# Patient Record
Sex: Female | Born: 1965 | Race: Black or African American | Hispanic: No | Smoking: Never smoker
Health system: Southern US, Community
[De-identification: ages and names within clinical notes are randomized; demographics above are authoritative.]

## PROBLEM LIST (undated history)

## (undated) DIAGNOSIS — M329 Systemic lupus erythematosus, unspecified: Secondary | ICD-10-CM

## (undated) DIAGNOSIS — IMO0002 Reserved for concepts with insufficient information to code with codable children: Secondary | ICD-10-CM

## (undated) DIAGNOSIS — K219 Gastro-esophageal reflux disease without esophagitis: Secondary | ICD-10-CM

---

## 2003-10-08 ENCOUNTER — Encounter: Admission: RE | Admit: 2003-10-08 | Discharge: 2003-10-08 | Payer: Self-pay | Admitting: Occupational Medicine

## 2004-04-26 ENCOUNTER — Emergency Department (HOSPITAL_COMMUNITY): Admission: EM | Admit: 2004-04-26 | Discharge: 2004-04-26 | Payer: Self-pay | Admitting: Family Medicine

## 2004-09-08 ENCOUNTER — Ambulatory Visit: Payer: Self-pay | Admitting: Family Medicine

## 2005-09-08 ENCOUNTER — Ambulatory Visit: Payer: Self-pay | Admitting: Hematology & Oncology

## 2005-10-24 ENCOUNTER — Ambulatory Visit: Payer: Self-pay | Admitting: Hematology & Oncology

## 2005-12-26 ENCOUNTER — Ambulatory Visit: Payer: Self-pay | Admitting: Hematology & Oncology

## 2006-02-27 ENCOUNTER — Ambulatory Visit: Payer: Self-pay | Admitting: Hematology & Oncology

## 2006-09-21 ENCOUNTER — Ambulatory Visit (HOSPITAL_COMMUNITY): Admission: RE | Admit: 2006-09-21 | Discharge: 2006-09-21 | Payer: Self-pay | Admitting: Obstetrics and Gynecology

## 2006-10-26 ENCOUNTER — Encounter: Admission: RE | Admit: 2006-10-26 | Discharge: 2006-10-26 | Payer: Self-pay | Admitting: Obstetrics and Gynecology

## 2007-12-21 IMAGING — MG MM DIAGNOSTIC UNILATERAL L
2 series · 2 of 2 positions shown · non-contrast
Comparison: none

DG DIAGNOSTIC UNILATERAL L
CC and MLO view(s) were taken of the left breast.

DIGITAL UNILATERAL LEFT DIAGNOSTIC MAMMOGRAM WITH CAD:
CLINICAL DATA: Possible left breast mass at recent screening mammography.
True lateral and spot compression oblique views of the left breast were obtained.  With spot 
compression, normal-appearing parenchyma is demonstrated at the location of the recently suspected 
mass.  No mass is seen on the true lateral view.

[L MLO]
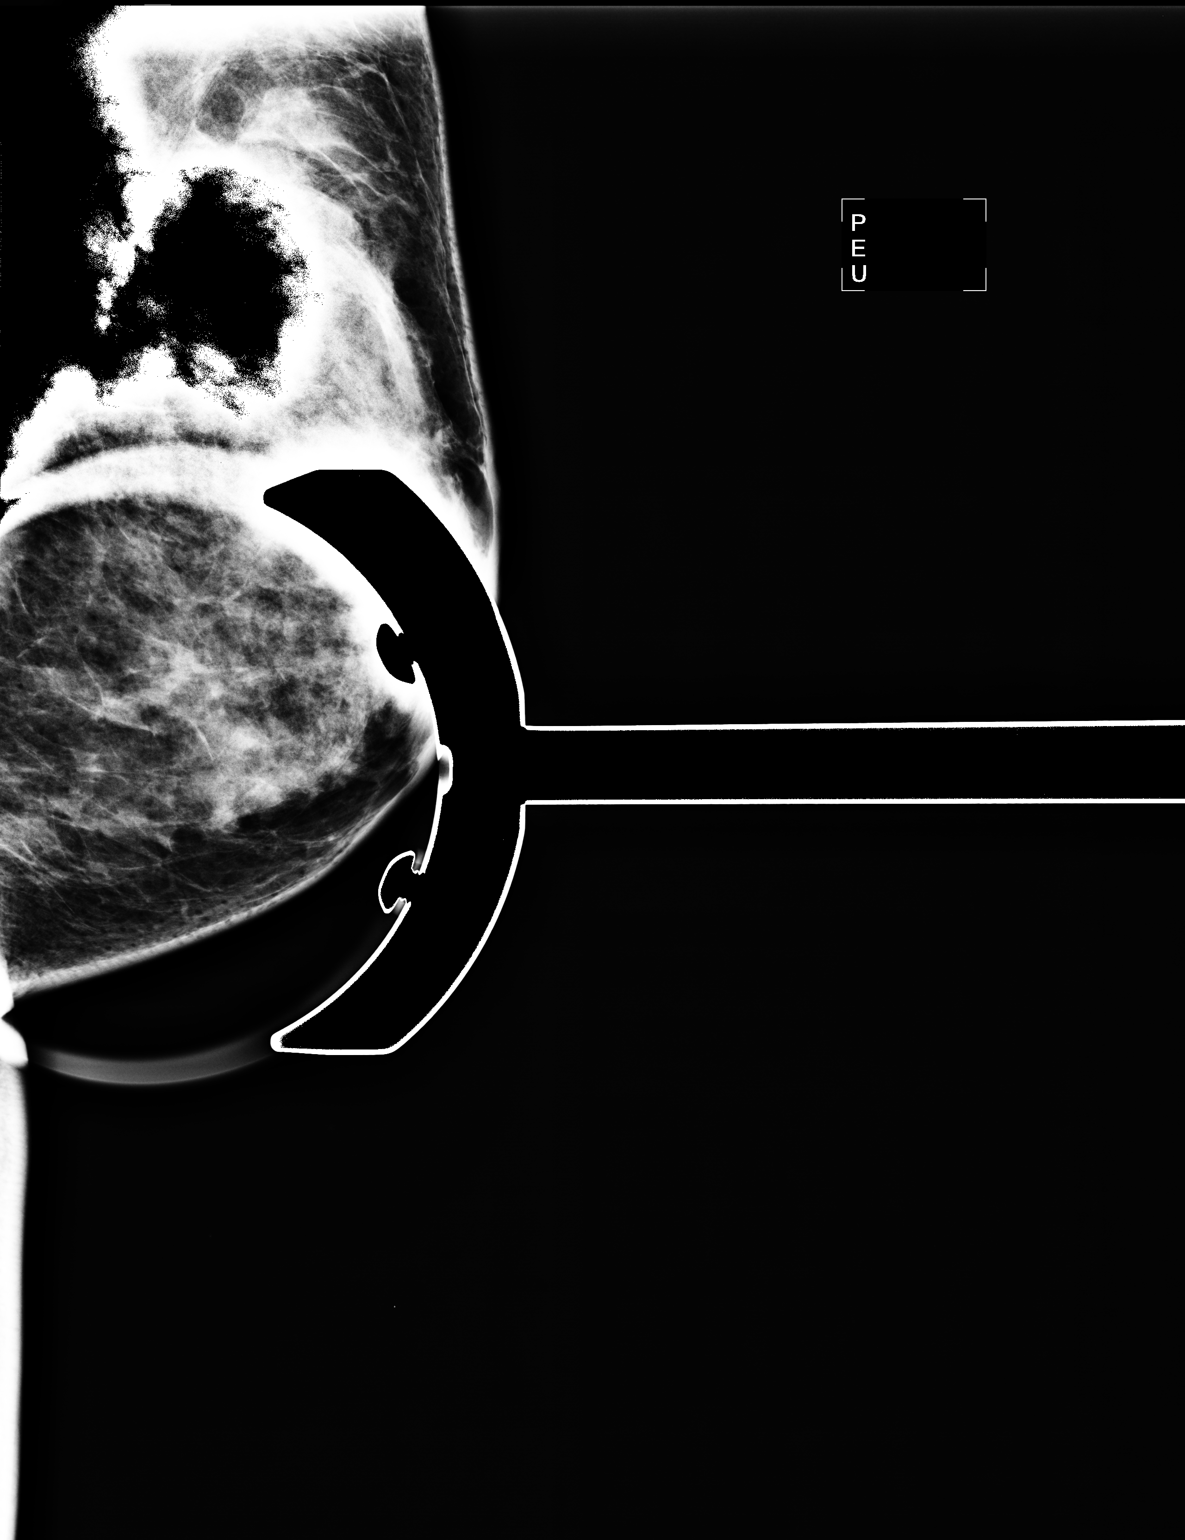

[L ML]
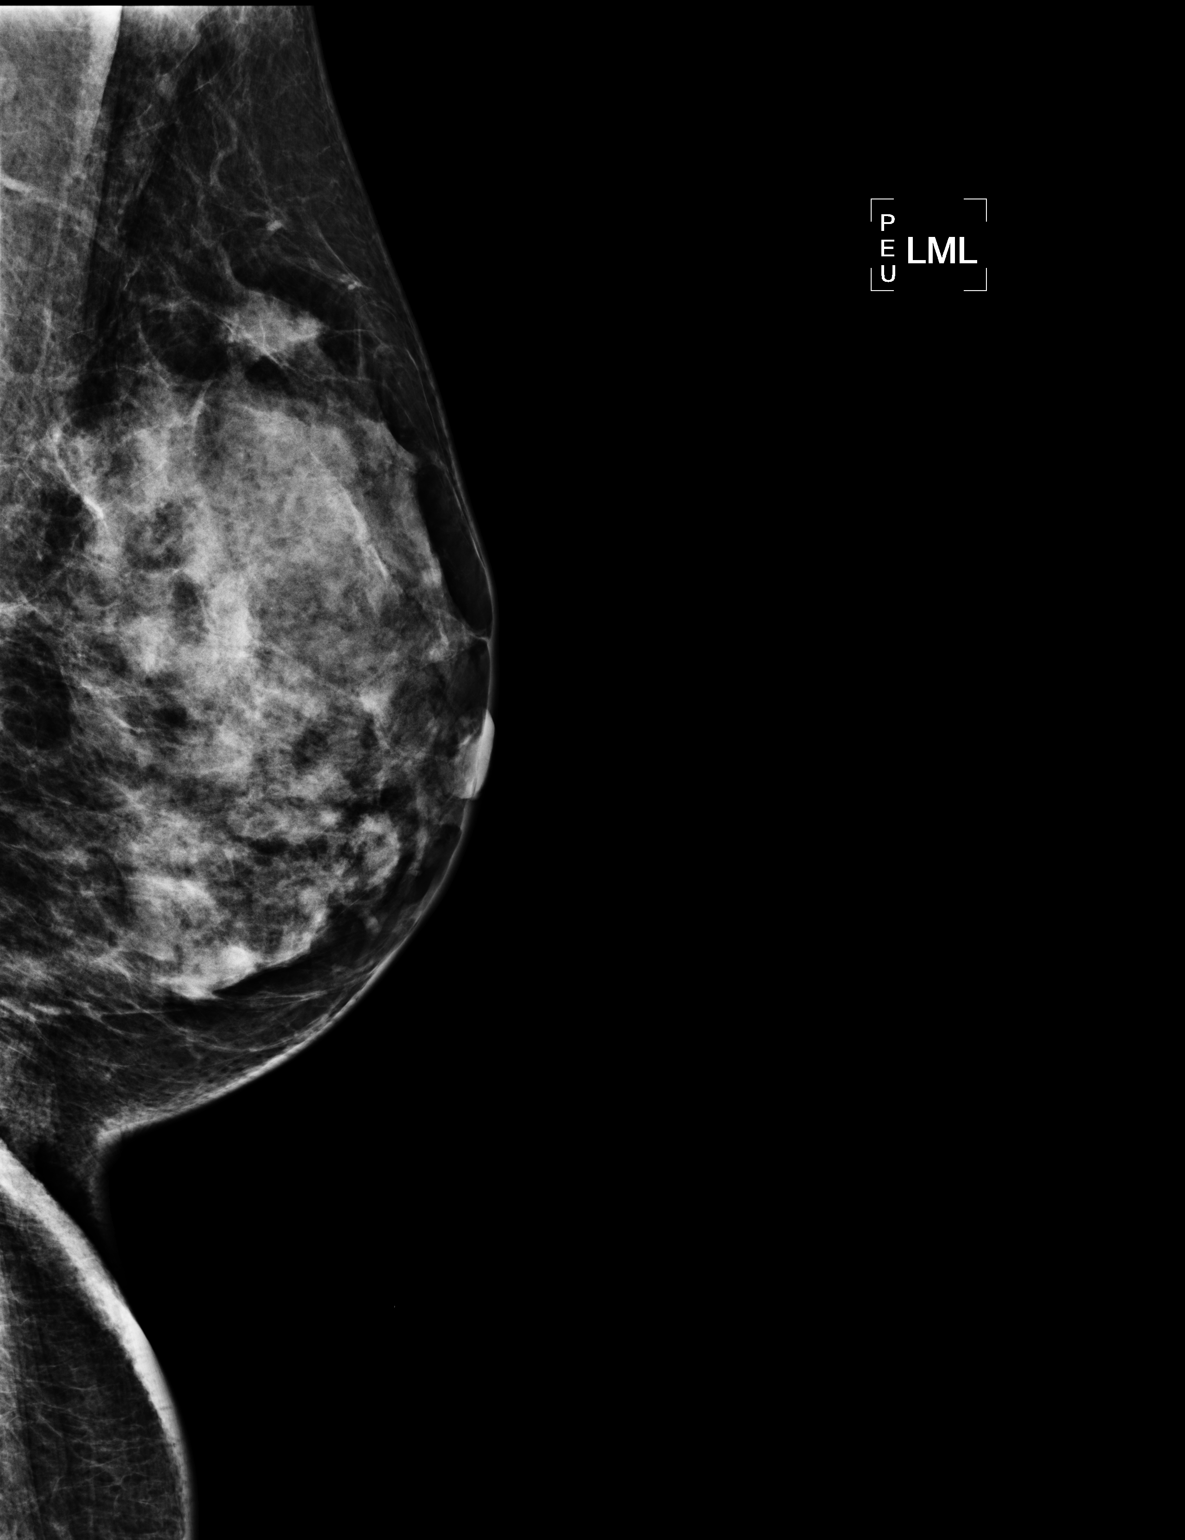

[2 of 2 positions shown; findings below may reference images not displayed]

IMPRESSION: No evidence of malignancy.  The recently suspected left breast mass represented overlapping of 
normal tissue. Screening mammography is recommended in one year.

ASSESSMENT: Negative - BI-RADS 1

Screening mammogram of both breasts in 1 year.
ANALYZED BY COMPUTER AIDED DETECTION. ,

## 2010-08-11 ENCOUNTER — Ambulatory Visit: Payer: Self-pay | Admitting: Family Medicine

## 2010-12-18 ENCOUNTER — Encounter: Payer: Self-pay | Admitting: Diagnostic Radiology

## 2010-12-18 ENCOUNTER — Encounter (HOSPITAL_COMMUNITY): Payer: Self-pay | Admitting: Obstetrics and Gynecology

## 2011-05-12 ENCOUNTER — Ambulatory Visit: Payer: Self-pay | Admitting: Family Medicine

## 2011-08-31 ENCOUNTER — Ambulatory Visit: Payer: Self-pay | Admitting: Family Medicine

## 2012-01-08 ENCOUNTER — Other Ambulatory Visit: Payer: Self-pay | Admitting: Gastroenterology

## 2012-10-25 IMAGING — US ABDOMEN ULTRASOUND LIMITED
1 series · 17 of 25 positions shown · non-contrast
Comparison: none

REASON FOR EXAM: Epigastric RUQ Acid Reflux
COMMENTS:

[Series 1: abdomen ultrasound limited · 17 of 52 slices shown]
[im 1/52]
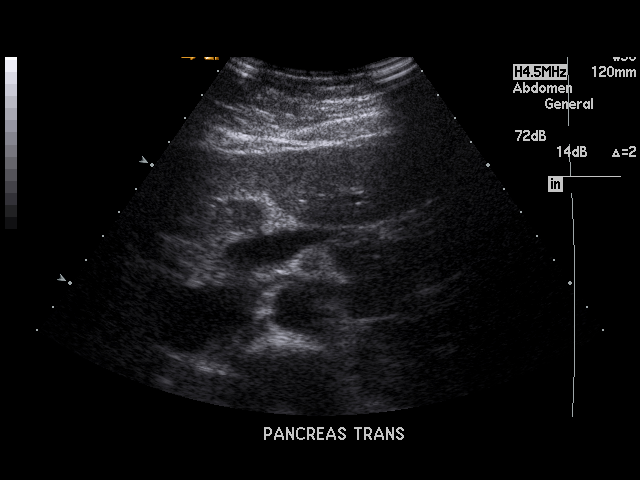
[im 5/52]
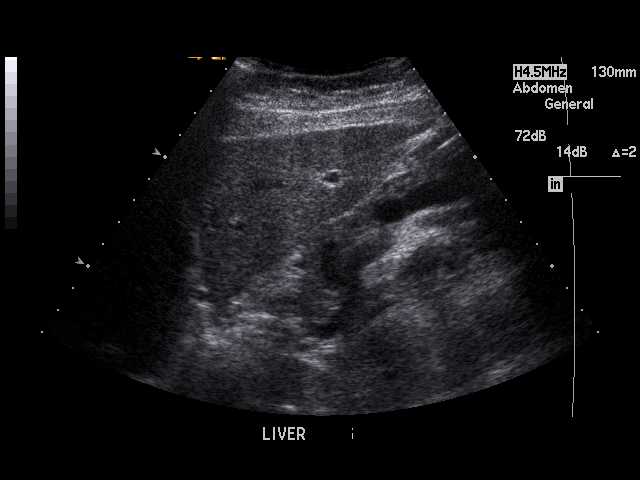
[im 7/52]
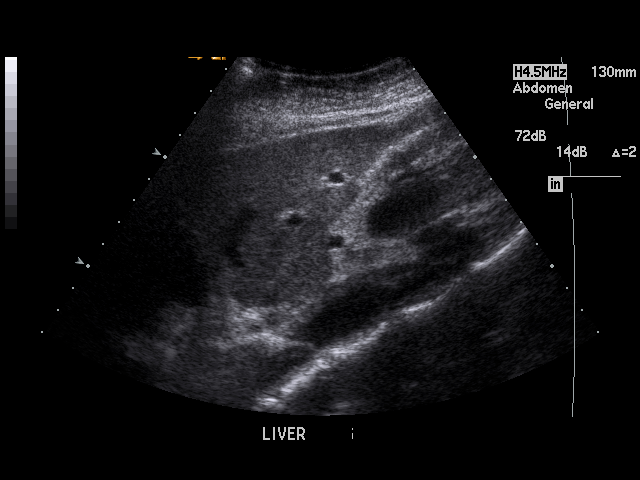
[im 11/52]
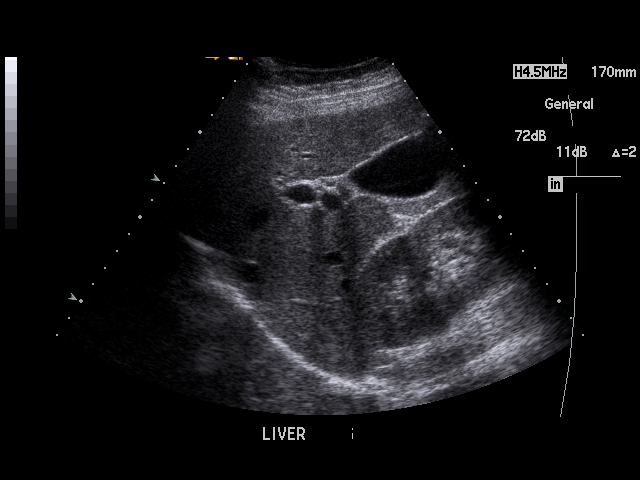
[im 13/52]
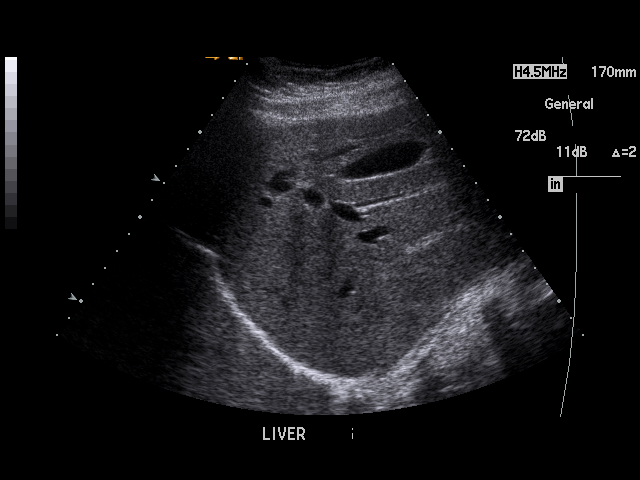
[im 18/52]
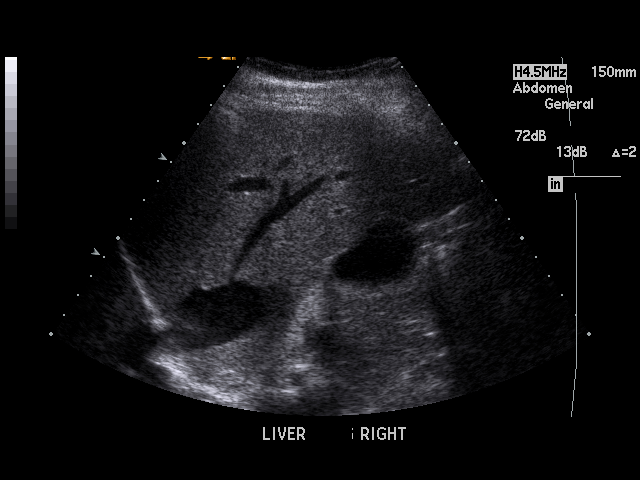
[im 20/52]
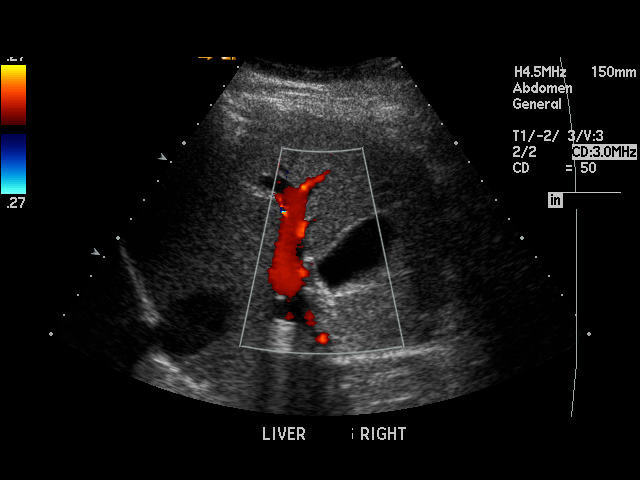
[im 24/52]
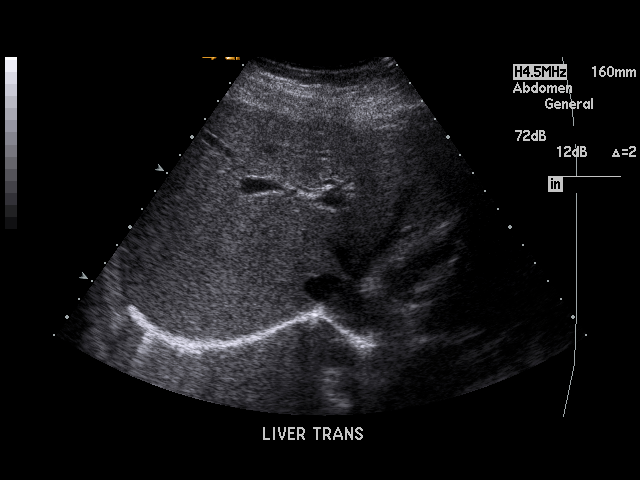
[im 26/52]
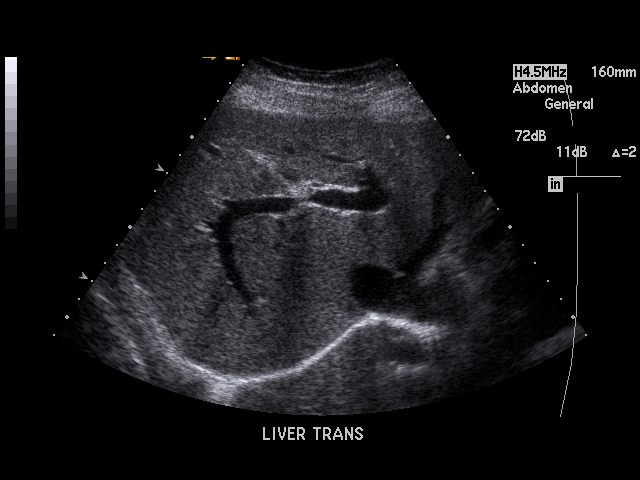
[im 28/52]
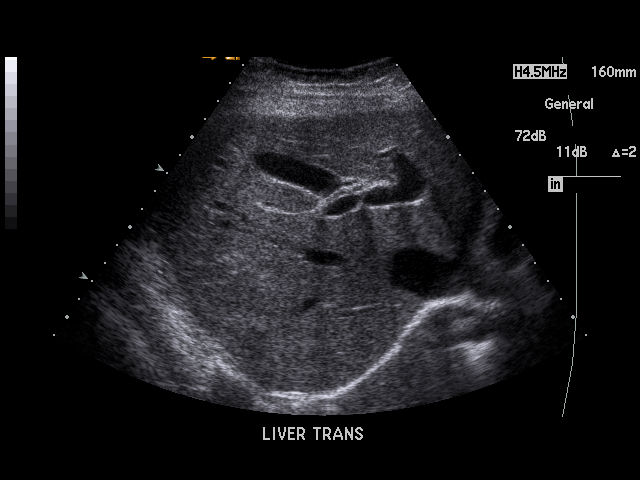
[im 32/52]
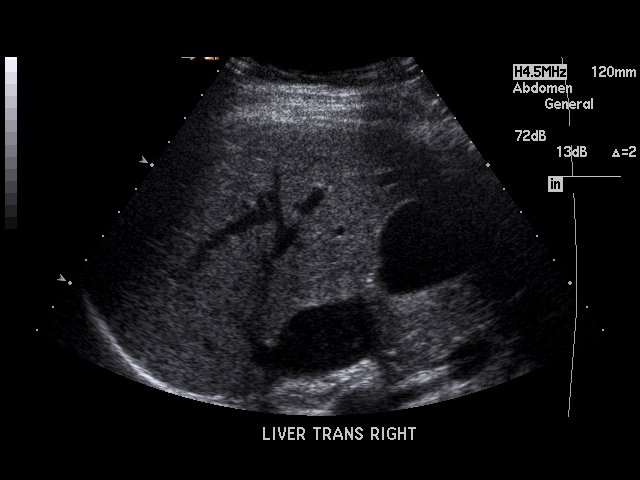
[im 35/52]
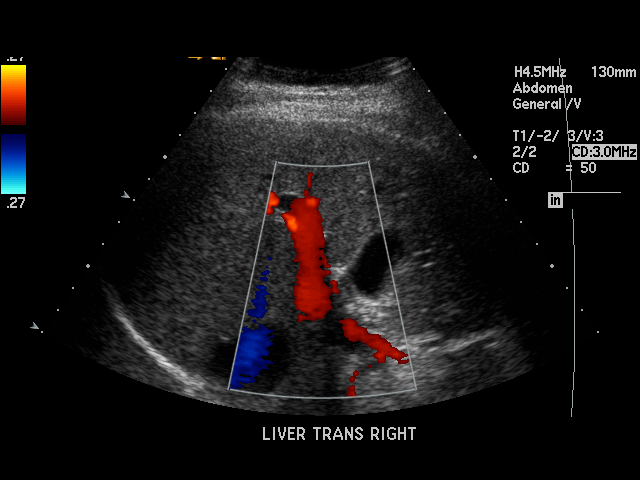
[im 39/52]
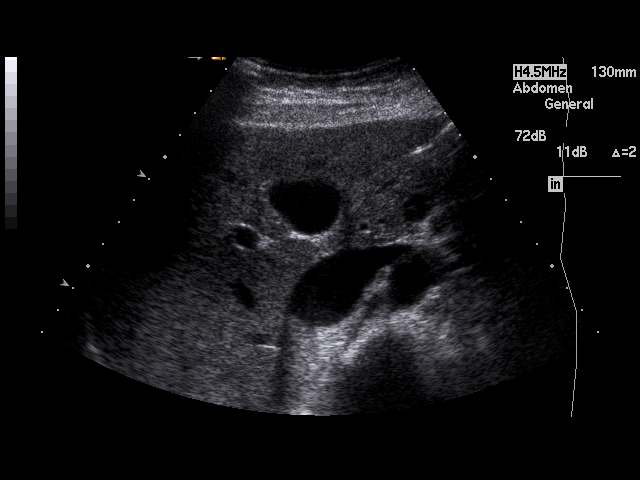
[im 41/52]
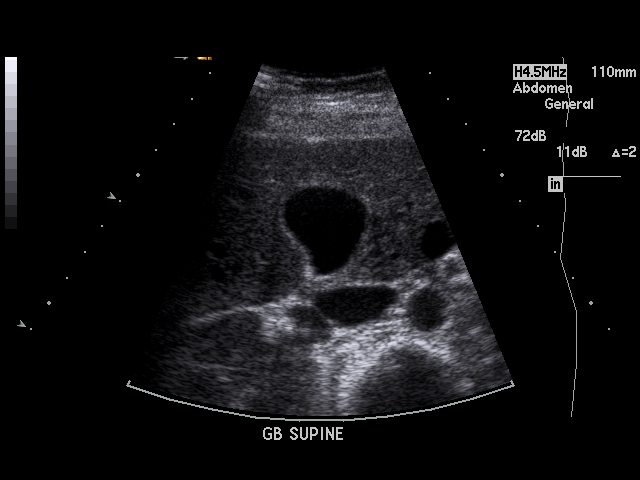
[im 45/52]
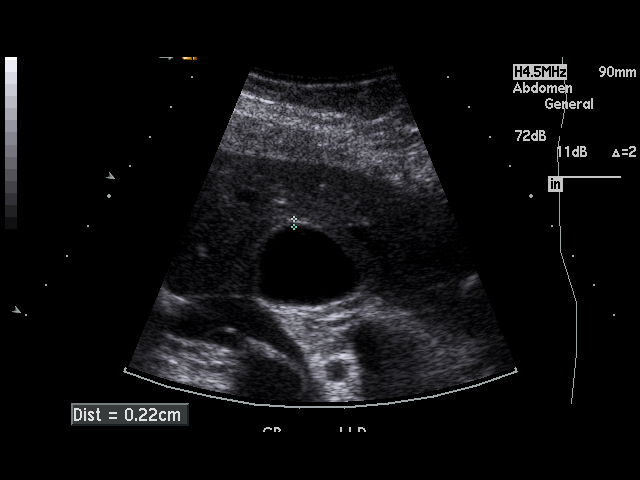
[im 47/52]
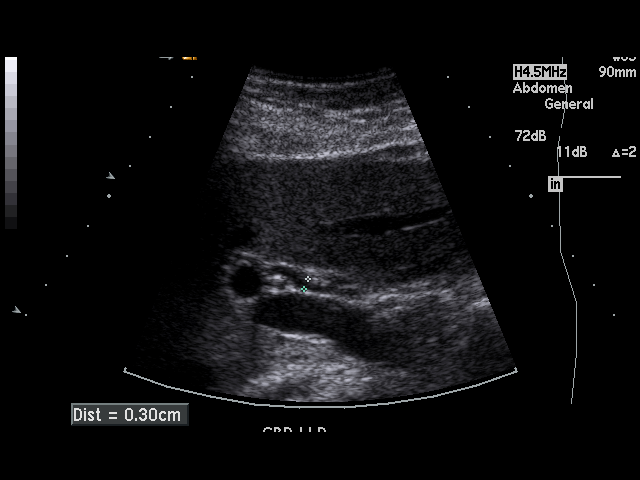
[im 52/52]
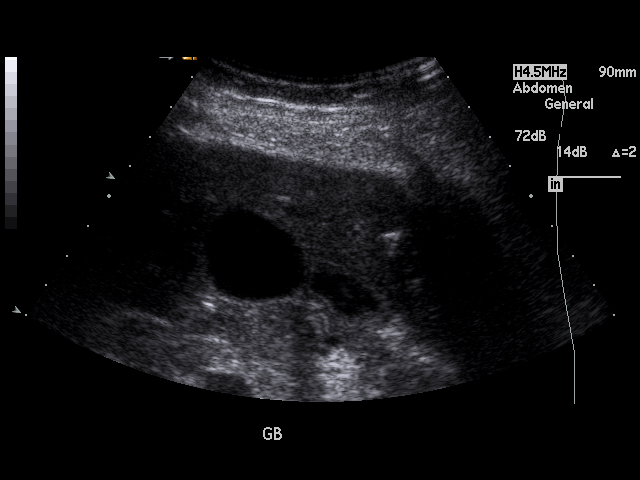

[17 of 25 positions shown; findings below may reference images not displayed]

PROCEDURE:     US  - US ABDOMEN LIMITED SURVEY  - August 31, 2011  [DATE]

RESULT:     Limited abdominal ultrasound was performed for evaluation of the
right upper quadrant. The liver, pancreas and gallbladder are normal in
appearance. No gallstones are seen. The common bile duct measures 3.6 mm in
diameter which is within normal limits.
IMPRESSION: No significant abnormalities are identified.

## 2013-10-06 ENCOUNTER — Observation Stay (HOSPITAL_COMMUNITY)
Admission: EM | Admit: 2013-10-06 | Discharge: 2013-10-06 | Disposition: A | Discharge status: Home / Self Care | Payer: Commercial Managed Care - PPO | Attending: Internal Medicine | Admitting: Internal Medicine

## 2013-10-06 ENCOUNTER — Encounter (HOSPITAL_COMMUNITY): Payer: Self-pay | Admitting: Emergency Medicine

## 2013-10-06 ENCOUNTER — Emergency Department (HOSPITAL_COMMUNITY): Payer: Commercial Managed Care - PPO

## 2013-10-06 DIAGNOSIS — R072 Precordial pain: Secondary | ICD-10-CM

## 2013-10-06 DIAGNOSIS — M329 Systemic lupus erythematosus, unspecified: Secondary | ICD-10-CM | POA: Insufficient documentation

## 2013-10-06 DIAGNOSIS — R0789 Other chest pain: Principal | ICD-10-CM | POA: Diagnosis present

## 2013-10-06 DIAGNOSIS — R079 Chest pain, unspecified: Secondary | ICD-10-CM | POA: Diagnosis present

## 2013-10-06 DIAGNOSIS — IMO0002 Reserved for concepts with insufficient information to code with codable children: Secondary | ICD-10-CM | POA: Diagnosis present

## 2013-10-06 DIAGNOSIS — K219 Gastro-esophageal reflux disease without esophagitis: Secondary | ICD-10-CM | POA: Insufficient documentation

## 2013-10-06 DIAGNOSIS — Z79899 Other long term (current) drug therapy: Secondary | ICD-10-CM | POA: Insufficient documentation

## 2013-10-06 HISTORY — DX: Systemic lupus erythematosus, unspecified: M32.9

## 2013-10-06 HISTORY — DX: Reserved for concepts with insufficient information to code with codable children: IMO0002

## 2013-10-06 HISTORY — DX: Gastro-esophageal reflux disease without esophagitis: K21.9

## 2013-10-06 LAB — CBC
HCT: 33.9 % — ABNORMAL LOW (ref 36.0–46.0)
HCT: 33.4 % — ABNORMAL LOW (ref 36.0–46.0)
Hemoglobin: 11.3 g/dL — ABNORMAL LOW (ref 12.0–15.0)
Hemoglobin: 11.4 g/dL — ABNORMAL LOW (ref 12.0–15.0)
MCH: 29.6 pg (ref 26.0–34.0)
MCHC: 33.3 g/dL (ref 30.0–36.0)
MCV: 88.7 fL (ref 78.0–100.0)
Platelets: 137 10*3/uL — ABNORMAL LOW (ref 150–400)
Platelets: 141 10*3/uL — ABNORMAL LOW (ref 150–400)
RBC: 3.82 MIL/uL — ABNORMAL LOW (ref 3.87–5.11)
RBC: 3.79 MIL/uL — ABNORMAL LOW (ref 3.87–5.11)
RDW: 11.8 % (ref 11.5–15.5)
WBC: 3.4 10*3/uL — ABNORMAL LOW (ref 4.0–10.5)
WBC: 2.7 10*3/uL — ABNORMAL LOW (ref 4.0–10.5)

## 2013-10-06 LAB — COMPREHENSIVE METABOLIC PANEL
ALT: 18 U/L (ref 0–35)
AST: 23 U/L (ref 0–37)
Albumin: 3.7 g/dL (ref 3.5–5.2)
Alkaline Phosphatase: 77 U/L (ref 39–117)
BUN: 17 mg/dL (ref 6–23)
CO2: 23 mEq/L (ref 19–32)
Calcium: 9.5 mg/dL (ref 8.4–10.5)
Chloride: 105 mEq/L (ref 96–112)
Creatinine, Ser: 0.66 mg/dL (ref 0.50–1.10)
GFR calc Af Amer: >90 mL/min (ref >90)
GFR calc non Af Amer: >90 mL/min (ref >90)
Glucose, Bld: 99 mg/dL (ref 70–99)
Potassium: 3.4 mEq/L — ABNORMAL LOW (ref 3.5–5.1)
Sodium: 138 mEq/L (ref 135–145)
Total Bilirubin: 0.3 mg/dL (ref 0.3–1.2)
Total Protein: 7.2 g/dL (ref 6.0–8.3)

## 2013-10-06 LAB — TROPONIN I
Troponin I: <0.3 ng/mL (ref <0.30)
Troponin I: <0.3 ng/mL (ref <0.30)

## 2013-10-06 LAB — POCT I-STAT, CHEM 8
BUN: 16 mg/dL (ref 6–23)
Calcium, Ion: 1.28 mmol/L — ABNORMAL HIGH (ref 1.12–1.23)
Chloride: 107 mEq/L (ref 96–112)
Creatinine, Ser: 0.8 mg/dL (ref 0.50–1.10)
Glucose, Bld: 101 mg/dL — ABNORMAL HIGH (ref 70–99)
HCT: 34 % — ABNORMAL LOW (ref 36.0–46.0)
Hemoglobin: 11.6 g/dL — ABNORMAL LOW (ref 12.0–15.0)
Potassium: 3.6 mEq/L (ref 3.5–5.1)
Sodium: 143 mEq/L (ref 135–145)
TCO2: 21 mmol/L (ref 0–100)

## 2013-10-06 LAB — PROTIME-INR
INR: 0.99 (ref 0.00–1.49)
Prothrombin Time: 12.9 seconds (ref 11.6–15.2)

## 2013-10-06 LAB — RAPID URINE DRUG SCREEN, HOSP PERFORMED
Benzodiazepines: NOT DETECTED
Cocaine: NOT DETECTED

## 2013-10-06 LAB — POCT I-STAT TROPONIN I: Troponin i, poc: 0 ng/mL (ref 0.00–0.08)

## 2013-10-06 LAB — TSH: TSH: 1.424 u[IU]/mL (ref 0.350–4.500)

## 2013-10-06 LAB — CREATININE, SERUM
GFR calc Af Amer: >90 mL/min (ref >90)
GFR calc non Af Amer: >90 mL/min (ref >90)

## 2013-10-06 LAB — APTT: aPTT: 31 seconds (ref 24–37)

## 2013-10-06 MED ORDER — MORPHINE SULFATE 2 MG/ML IJ SOLN: 2.0000 mg | INTRAMUSCULAR | Status: DC | PRN

## 2013-10-06 MED ORDER — ONDANSETRON HCL 4 MG/2ML IJ SOLN: 4.0000 mg | Freq: Three times a day (TID) | INTRAMUSCULAR | Status: DC | PRN

## 2013-10-06 MED ORDER — ASPIRIN 81 MG PO CHEW
324.0000 mg | CHEWABLE_TABLET | Freq: Once | ORAL | Status: AC
  Administered 2013-10-06: 324 mg via ORAL
  Filled 2013-10-06: qty 4

## 2013-10-06 MED ORDER — GI COCKTAIL ~~LOC~~: 30.0000 mL | Freq: Four times a day (QID) | ORAL | Status: DC | PRN

## 2013-10-06 MED ORDER — PANTOPRAZOLE SODIUM 40 MG PO TBEC
40.0000 mg | DELAYED_RELEASE_TABLET | Freq: Every day | ORAL | Status: DC
  Administered 2013-10-06: 40 mg via ORAL
  Filled 2013-10-06: qty 1

## 2013-10-06 MED ORDER — ONDANSETRON HCL 4 MG/2ML IJ SOLN: 4.0000 mg | Freq: Four times a day (QID) | INTRAMUSCULAR | Status: DC | PRN

## 2013-10-06 MED ORDER — ENOXAPARIN SODIUM 40 MG/0.4ML ~~LOC~~ SOLN
40.0000 mg | SUBCUTANEOUS | Status: DC
  Administered 2013-10-06: 40 mg via SUBCUTANEOUS
  Filled 2013-10-06 (×2): qty 0.4

## 2013-10-06 MED ORDER — ASPIRIN 81 MG PO CHEW: 324.0000 mg | CHEWABLE_TABLET | Freq: Once | ORAL | Status: DC

## 2013-10-06 MED ORDER — ADULT MULTIVITAMIN W/MINERALS CH
1.0000 | ORAL_TABLET | Freq: Every day | ORAL | Status: DC
  Administered 2013-10-06: 1 via ORAL
  Filled 2013-10-06 (×2): qty 1

## 2013-10-06 MED ORDER — ACETAMINOPHEN 325 MG PO TABS: 650.0000 mg | ORAL_TABLET | ORAL | Status: DC | PRN

## 2013-10-06 MED ORDER — ASPIRIN EC 325 MG PO TBEC: 325.0000 mg | DELAYED_RELEASE_TABLET | Freq: Every day | ORAL | Status: DC

## 2013-10-06 MED ORDER — NITROGLYCERIN 0.4 MG SL SUBL
0.4000 mg | SUBLINGUAL_TABLET | SUBLINGUAL | Status: DC | PRN
Start: 2013-10-06 — End: 2013-10-06

## 2013-10-06 NOTE — Progress Notes (Signed)
  Echocardiogram Echocardiogram Stress Test has been performed.  Wake Conlee FRANCES 10/06/2013, 1:17 PM

## 2013-10-06 NOTE — H&P (Signed)
[facility]                                                                                    Patient Demographics  Sierra Dillon, is a 47 y.o. female  MRN: [id number]   DOB - [birth year]  Admit Date - 10/06/2013  Outpatient Primary MD for the patient is [facility] in [place]   With History of -  Past Medical History  Diagnosis Date  . Lupus   . GERD (gastroesophageal reflux disease)       Past Surgical History  Procedure Laterality Date  . Cesarean section      in for   Chief Complaint  Patient presents with  . Chest Pain     HPI  Sierra Dillon  is a 47 y.o. female, arriving at [facility] from ambulance with dizziness, central chest pressure, and nausea. She awoke early this morning with chest pain described as pressure in the right side of her chest. She sat up to get TUMS and water and felt dizzy and nauseous. She took the [facility] and laid back down and still felt pressure in her chest, nausea, and dizziness. She became worried by her symptoms and got in her car to drive to the hospital. Soon after driving she felt more dizzy and stopped at a neighbors house who called 911. She has had similar symptoms in the past, but these were much more severe. Reports sometimes feeling dizzy in mornings. One year ago she thought she was having a heart attack with pressure on the right side of her chest. Was found to have GERD at that time and has been on protonix since. In the past 2 weeks has had reflux symptoms (burning) with chest pressure awaking her at night and sometimes causing her distress at work during the day. Also reports sometimes waking with numbness in right arm and leg, although she did not experience this today. Reports that she sometimes has headaches for which she does not take medicine. Denies any ripping/tearing chest pain, chest pressure does not radiate, denies change in vision. Had a UTI one week ago and was treated with antibiotics and symptoms have resolved.  Has diagnosis 13 years ago of "suspected" Lupus, but has not had any recurrence of symptoms since that time. Is being seen now by rheumatologist for possible Sjogren's syndrome. No other prior medical issues. Has no family history of heart attack, CAD, diabetes, hyperlipidemia, hypertension. Both parents are in good health.   Review of Systems    In addition to the HPI above, No Fever-chills, No changes with Vision or hearing, No problems swallowing food or Liquids, No Cough or Shortness of Breath, No Abdominal pain, or Vommitting, Bowel movements are regular, No Blood in stool or Urine, No dysuria, No new skin rashes or bruises, No new joints pains-aches,  No new weakness, tingling in any extremity, No recent weight gain or loss, No polyuria, polydypsia or polyphagia, No significant Mental Stressors.  A full 10 point Review of Systems was done, except as stated above, all other Review of Systems were negative.   Social History History  Substance Use Topics  . Smoking status: Never  Smoker   . Smokeless tobacco: Not on file  . Alcohol Use: Yes     Comment: occasionally  Reports 5-6 glasses of wine per month. Does not smoke. Does not use illicit drugs to include cocaine.   Family History No family history on file.   Prior to Admission medications   Medication Sig Start Date End Date Taking? Authorizing Provider  BIOTIN PO Take 1 tablet by mouth daily.   Yes Historical Provider, MD  Multiple Vitamin (MULTIVITAMIN WITH MINERALS) TABS tablet Take 1 tablet by mouth daily.   Yes Historical Provider, MD  pantoprazole (PROTONIX) 40 MG tablet Take 40 mg by mouth daily.   Yes Historical Provider, MD    No Known Allergies  Physical Exam  Vitals  Blood pressure 148/81, pulse 71, temperature 97.8 F (36.6 C), temperature source Oral, resp. rate 18, SpO2 97.00%.   General:  lying in bed in NAD   Psych:  Normal affect and insight, Not Suicidal or Homicidal, Awake Alert,  Oriented X 3.  Neuro:   No F.N deficits, ALL C.Nerves Intact, Strength 5/5 all 4 extremities, Sensation intact all 4 extremities, Plantars down going.   ENT:  Ears and Eyes appear Normal, Conjunctivae clear, PERRLA. Moist Oral Mucosa.  Neck:  Supple Neck, No JVD, No Carotid Bruits.  Respiratory:  Symmetrical Chest wall movement, Good air movement bilaterally, CTAB.  Cardiac:  RRR, No Gallops, Rubs or Murmurs, No Parasternal Heave. No reproducable pain with palpation.  Abdomen:  Positive Bowel Sounds, Abdomen Soft, Non tender, No organomegaly appriciated  Skin:  No Cyanosis, Normal Skin Turgor, No Skin Rash or Bruise.  Extremities:  Good muscle tone,  Right ankle shows mild edema, no effusions, Normal ROM.   Data Review  CBC  Recent Labs Lab 10/06/13 0445 10/06/13 0453 10/06/13 0900  WBC 3.4*  --  2.7*  HGB 11.3* 11.6* 11.4*  HCT 33.9* 34.0* 33.4*  PLT 137*  --  141*  MCV 88.7  --  88.1  MCH 29.6  --  30.1  MCHC 33.3  --  34.1  RDW 11.8  --  11.6   ------------------------------------------------------------------------------------------------------------------  Chemistries   Recent Labs Lab 10/06/13 0445 10/06/13 0453  NA 138 143  K 3.4* 3.6  CL 105 107  CO2 23  --   GLUCOSE 99 101*  BUN 17 16  CREATININE 0.66 0.80  CALCIUM 9.5  --   AST 23  --   ALT 18  --   ALKPHOS 77  --   BILITOT 0.3  --      Coagulation profile  Recent Labs Lab 10/06/13 0445  INR 0.99    ----------------------------------------------------------------------------------------------------------------  Imaging results:   Dg Chest Portable 1 View  10/06/2013   CLINICAL DATA:  Right-sided chest pain, nausea and dizziness.  EXAM: PORTABLE CHEST - 1 VIEW  COMPARISON:  None.  FINDINGS: The lungs are well-aerated and clear. There is no evidence of focal opacification, pleural effusion or pneumothorax.  The cardiomediastinal silhouette is within normal limits. No acute osseous  abnormalities are seen.  IMPRESSION: No acute cardiopulmonary process seen.   Electronically Signed   By: Roanna Raider M.D.   On: 10/06/2013 05:15    My personal review of EKG: Rhythm NSR, Rate  76 /min, QTc 450 , no Acute ST changes    Assessment & Plan  Principal Problem:   Chest tightness or pressure Active Problems:   GERD (gastroesophageal reflux disease)   Lupus   1. Atypical Chest Pressure -  GERD vs Angina. EKG normal. Troponin negative thus far. Will continue to monitor. - Serial troponins (q6 hours x3) - Control pain with tylenol, or morphine as needed - Cardiac Monitoring - Cardiology consulted - NPO until cleared from cardiology  2. GERD - Pantoprazole  3. - Lupus/Sjogren's Syndrome - No symptoms at this time    DVT Prophylaxis: Lovenox - SCDs  AM Labs Ordered, also please review Full Orders  Family Communication:   none   Code Status:  Full   DC to home when medically stable.  Time spent in minutes : 60    Georgina Quint PA-S on 10/06/2013 at 9:56 AM Algis Downs, PA-C  Between 7am to 7pm - Pager - [phone]  After 7pm go to [email or web address] - password [email or web address]  And look for the night coverage person covering me after hours  [facility] Office  [phone]

## 2013-10-06 NOTE — ED Notes (Signed)
Per ems-- pt c/o right sided cpx 1 week radiates to R shoulder described as pressre. Also states feels like she is having palpitations this am. With nausea and dizziness. Denies sob. Pt with hx of lupus- no flare in 10 years. Pt dx with UTI over 1 week ago and finished antibiotics. Vs stable.

## 2013-10-06 NOTE — H&P (Signed)
  I have directly reviewed the clinical findings, lab, imaging studies and management of this patient in detail. I have interviewed and examined the patient and agree with the documentation,  as recorded by the Physician extender.  Leroy Sea M.D on 10/06/2013 at 10:01 AM  [facility] Office  [phone]

## 2013-10-06 NOTE — ED Provider Notes (Signed)
CSN: [id number]     Arrival date & time 10/06/13  0356 History   First MD Initiated Contact with Patient 10/06/13 0359     Chief Complaint  Patient presents with  . Chest Pain   (Consider location/radiation/quality/duration/timing/severity/associated sxs/prior Treatment) HPI Comments: Sierra Dillon is a 47 year old female with a distant history of lupus, no symptoms in over 15 years according to her report who presents with a complaint of chest pain which is just right of the sternum, a pressure sensation which she has been having for several weeks, gradually worsening in intensity as well as the length of time that she feels the symptoms. They generally occur later in the evening or night time, she has had episodes during the day while she has been walking that has made her come to a complete stop and rest until the pain goes away. She denies shortness of breath cough fevers swelling of the legs or any other complaints. When the pain does occur she does get nauseated  Patient is a 47 y.o. female presenting with chest pain. The history is provided by the patient.  Chest Pain   Past Medical History  Diagnosis Date  . Lupus   . GERD (gastroesophageal reflux disease)    Past Surgical History  Procedure Laterality Date  . Cesarean section     No family history on file. History  Substance Use Topics  . Smoking status: Never Smoker   . Smokeless tobacco: Not on file  . Alcohol Use: Yes     Comment: occasionally   OB History   Grav Para Term Preterm Abortions TAB SAB Ect Mult Living                 Review of Systems  Cardiovascular: Positive for chest pain.  All other systems reviewed and are negative.    Allergies  Review of patient's allergies indicates no known allergies.  Home Medications   Current Outpatient Rx  Name  Route  Sig  Dispense  Refill  . BIOTIN PO   Oral   Take 1 tablet by mouth daily.         . Multiple Vitamin (MULTIVITAMIN WITH MINERALS) TABS tablet  Oral   Take 1 tablet by mouth daily.         . pantoprazole (PROTONIX) 40 MG tablet   Oral   Take 40 mg by mouth daily.          BP 139/75  Temp(Src) 98.2 F (36.8 C) (Oral)  Resp 16  SpO2 100% Physical Exam  Nursing note and vitals reviewed. Constitutional: She appears well-developed and well-nourished. No distress.  HENT:  Head: Normocephalic and atraumatic.  Mouth/Throat: Oropharynx is clear and moist. No oropharyngeal exudate.  Eyes: Conjunctivae and EOM are normal. Pupils are equal, round, and reactive to light. Right eye exhibits no discharge. Left eye exhibits no discharge. No scleral icterus.  Neck: Normal range of motion. Neck supple. No JVD present. No thyromegaly present.  Cardiovascular: Normal rate, regular rhythm, normal heart sounds and intact distal pulses.  Exam reveals no gallop and no friction rub.   No murmur heard. Pulmonary/Chest: Effort normal and breath sounds normal. No respiratory distress. She has no wheezes. She has no rales. She exhibits no tenderness.  Abdominal: Soft. Bowel sounds are normal. She exhibits no distension and no mass. There is no tenderness.  Musculoskeletal: Normal range of motion. She exhibits no edema and no tenderness.  Lymphadenopathy:    She has no cervical adenopathy.  Neurological: She is alert. Coordination normal.  Skin: Skin is warm and dry. No rash noted. No erythema.  Psychiatric: She has a normal mood and affect. Her behavior is normal.    ED Course  Procedures (including critical care time) Labs Review Labs Reviewed  CBC - Abnormal; Notable for the following:    WBC 3.4 (*)    RBC 3.82 (*)    Hemoglobin 11.3 (*)    HCT 33.9 (*)    Platelets 137 (*)    All other components within normal limits  COMPREHENSIVE METABOLIC PANEL - Abnormal; Notable for the following:    Potassium 3.4 (*)    All other components within normal limits  POCT I-STAT, CHEM 8 - Abnormal; Notable for the following:    Glucose, Bld 101  (*)    Calcium, Ion 1.28 (*)    Hemoglobin 11.6 (*)    HCT 34.0 (*)    All other components within normal limits  PROTIME-INR  APTT  POCT I-STAT TROPONIN I   Imaging Review Dg Chest Portable 1 View  10/06/2013   CLINICAL DATA:  Right-sided chest pain, nausea and dizziness.  EXAM: PORTABLE CHEST - 1 VIEW  COMPARISON:  None.  FINDINGS: The lungs are well-aerated and clear. There is no evidence of focal opacification, pleural effusion or pneumothorax.  The cardiomediastinal silhouette is within normal limits. No acute osseous abnormalities are seen.  IMPRESSION: No acute cardiopulmonary process seen.   Electronically Signed   By: Roanna Raider M.D.   On: 10/06/2013 05:15    EKG Interpretation     Ventricular Rate:  76 PR Interval:  198 QRS Duration: 88 QT Interval:  400 QTC Calculation: 450 R Axis:   84 Text Interpretation:  Sinus rhythm No old tracing to compare Normal ECG            MDM   1. Chest pain    The patient has a fairly unremarkable physical exam, she has no reproducible tenderness over her chest wall and an EKG which shows no acute ischemia however her symptoms are concerning for a progressive anginal picture and I suspect that she will need admission to the hospital for further cardiac evaluation. Initial labs including troponin, basic metabolic panel and CBC have been ordered, chest x-ray, EKG.  Labs and chest x-ray are normal, the patient's symptoms are angina, she be admitted to the hospital.    Pt and consultant Dr. Toniann Fail in agreement.  Vida Roller, MD 10/06/13 [phone]

## 2013-10-06 NOTE — Progress Notes (Signed)
Utilization review completed.  

## 2013-10-06 NOTE — Discharge Summary (Signed)
[facility]                                                                                   Sierra Dillon, is a 47 y.o. female  DOB 05/08/1966  MRN [id number].  Admission date:  10/06/2013  Admitting Physician  Eduard Clos, MD  Discharge Date:  10/06/2013   Primary MD  No PCP Per Patient  Recommendations for primary care physician for things to follow:   Follow CBC and BMP closely, make sure patient follows with recommended subspecialists below.   Admission Diagnosis  GERD (gastroesophageal reflux disease) [530.81] Lupus [710.0] Chest pain [786.50]  Discharge Diagnosis   Non Cardiac musculoskeletal chest Pain  Principal Problem:   Chest tightness or pressure Active Problems:   GERD (gastroesophageal reflux disease)   Lupus      Past Medical History  Diagnosis Date  . Lupus   . GERD (gastroesophageal reflux disease)     Past Surgical History  Procedure Laterality Date  . Cesarean section       Discharge Condition: stable   Follow-up Information   Follow up with PCP and your rheumatologist. Schedule an appointment as soon as possible for a visit in 1 week.      Follow up with [facility]. Schedule an appointment as soon as possible for a visit in 1 week.   Contact information:   [address] [place] [address] [phone]      Follow up with CHISM, DAVID, MD. Schedule an appointment as soon as possible for a visit in 1 week. (or any hematologist - for pancytopenia)    Specialty:  Internal Medicine   Contact information:   [address] [place] [address] [phone]       Follow up with PCP. Schedule an appointment as soon as possible for a visit in 1 week.      Follow up with Willa Rough, MD. Schedule an appointment as soon as possible for a visit in 1 week.   Specialty:  Cardiology   Contact information:   [address] [place] [address] [phone]         Consults  obtained - Cardiology   Discharge Medications      Medication List         BIOTIN PO  Take 1 tablet by mouth daily.     multivitamin with minerals Tabs tablet  Take 1 tablet by mouth daily.     pantoprazole 40 MG tablet  Commonly known as:  PROTONIX  Take 40 mg by mouth daily.         Diet and Activity recommendation: See Discharge Instructions below   Discharge Instructions     Stress echo results: Left ventricular ejection fraction was normal at rest and with stress. There was no echocardiographic evidence for stress-induced ischemia.     Major procedures and Radiology Reports - PLEASE review detailed and final reports for all details, in brief -   Stress echo  Stress echo results: Left ventricular ejection fraction was normal at rest and with stress. There was no echocardiographic evidence for stress-induced ischemia.  Dg Chest Portable 1 View  10/06/2013   CLINICAL DATA:  Right-sided chest pain, nausea and dizziness.  EXAM: PORTABLE CHEST - 1 VIEW  COMPARISON:  None.  FINDINGS: The lungs are well-aerated and clear. There is no evidence of focal opacification, pleural effusion or pneumothorax.  The cardiomediastinal silhouette is within normal limits. No acute osseous abnormalities are seen.  IMPRESSION: No acute cardiopulmonary process seen.   Electronically Signed   By: Roanna Raider M.D.   On: 10/06/2013 05:15    Micro Results      No results found for this or any previous visit (from the past 240 hour(s)).   History of present illness and  Hospital Course:     Kindly see H&P for history of present illness and admission details, please review complete Labs, Consult reports and Test reports for all details in brief Sierra Dillon, is a 47 y.o. female, patient with history of  stable lupus versus Sjogren's for which she is following with rheumatology, was admitted with some right-sided chest pain and discomfort which sounded atypical, her troponin  was negative an EKG had no acute changes, she was seen by cardiology and underwent a stress echocardiogram which was unremarkable with preserved EF. Outpatient workup for noncardiac chest pain is recommended including outpatient EGD by GI.  Should continue outpatient rheumatological workup for possible Sjogren's versus lupus, she was also noted to be pancytopenic and one time outpatient hematology followup is recommended. She also complains of some intermittent right-sided tingling and numbness which has been ongoing for multiple months. Outpatient neuro workup one time should be prudent .  Currently completely symptom free I have discussed the plan with her she is agreeable to it.         Today   Subjective:   Sierra Dillon today has no headache,no chest abdominal pain,no new weakness tingling or numbness, feels much better wants to go home today.   Objective:   Blood pressure 148/81, pulse 71, temperature 97.8 F (36.6 C), temperature source Oral, resp. rate 18, SpO2 97.00%.  No intake or output data in the 24 hours ending 10/06/13 1546  Exam Awake Alert, Oriented *3, No new F.N deficits, Normal affect [place].AT,PERRAL Supple Neck,No JVD, No cervical lymphadenopathy appriciated.  Symmetrical Chest wall movement, Good air movement bilaterally, CTAB RRR,No Gallops,Rubs or new Murmurs, No Parasternal Heave +ve B.Sounds, Abd Soft, Non tender, No organomegaly appriciated, No rebound -guarding or rigidity. No Cyanosis, Clubbing or edema, No new Rash or bruise  Data Review   CBC w Diff:  Lab Results  Component Value Date   WBC 2.7* 10/06/2013   HGB 11.4* 10/06/2013   HCT 33.4* 10/06/2013   PLT 141* 10/06/2013    CMP:  Lab Results  Component Value Date   NA 143 10/06/2013   K 3.6 10/06/2013   CL 107 10/06/2013   CO2 23 10/06/2013   BUN 16 10/06/2013   CREATININE 0.62 10/06/2013   PROT 7.2 10/06/2013   ALBUMIN 3.7 10/06/2013   BILITOT 0.3 10/06/2013   ALKPHOS 77  10/06/2013   AST 23 10/06/2013   ALT 18 10/06/2013  .   Total Time in preparing paper work, data evaluation and todays exam - 35 minutes  Leroy Sea M.D on 10/06/2013 at 3:46 PM  [facility] Office  [phone]

## 2013-10-06 NOTE — Consult Note (Signed)
CARDIOLOGY CONSULT NOTE   Patient ID: Sierra Dillon MRN: [id number] DOB/AGE: 1966/04/11 47 y.o.  Admit date: 10/06/2013  Primary Physician   No PCP Per Patient Primary Cardiologist   none Reason for Consultation   Chest pain  HPI: Sierra Dillon is a 47 y.o. female with a hx of lupus and GERD who is being evaluated for chest pain and multiple somatic complaints. Patient reports an episode of severe chest pain approx two weeks ago. It was 10/10, sharp and stabbing, and worse with deep breaths. It was relieved leaning forward and eased off within in 5 minutes. After this event she has noticed right sided 7/10 pressure that occurs around 2 am on most nights and lasts around 10-15 minutes. The pain is a deep pressure that radiates to the back. She admits to it being worse with certain positions, but cannot pinpoint what exactly. Nothing seems to make it better. This morning around 2:30am, she had an episode of this chest pressure associated with severe nausea and dizziness. She walked over to her neighbor's house and he took her to the emergency department. She feels much better after GI cocktail and zofran in ED and is currently in no pain. She admits to feeling "like her heart is racing" sometimes. She denies fever, chills, night sweats, syncope, diaphoresis and SOB. Has no family history of MI, CAD, diabetes, hyperlipidemia, hypertension. Both parents are in good health and in their 61s. 1 year ago she was evaluated for burning chest pain and diagnosed with GERD. Protonix relieved this pain and she reports recent symptoms as different.  She has a questionable rhemuatologic had history. She reports a diagnosis of lupus but has had no symptoms in over 15 years. She visited an urgent care last week for a UTI where they discovered protinuria and some other positive blood work( we don't know what). They referred her for a rheumatology consult and her appointment is today. Patient also complains  of intermittent numbness in right arm and leg  Patient is currently pain free. Troponin negative and EKG is unremarkable.     Past Medical History  Diagnosis Date  . Lupus   . GERD (gastroesophageal reflux disease)      Past Surgical History  Procedure Laterality Date  . Cesarean section      No Known Allergies  I have reviewed the patient's current medications . aspirin  324 mg Oral Once  . [START ON 10/07/2013] aspirin EC  325 mg Oral Daily  . enoxaparin (LOVENOX) injection  40 mg Subcutaneous Q24H  . multivitamin with minerals  1 tablet Oral Daily  . pantoprazole  40 mg Oral Daily     acetaminophen, gi cocktail, morphine injection, nitroGLYCERIN, ondansetron (ZOFRAN) IV  Prior to Admission medications   Medication Sig Start Date End Date Taking? Authorizing Provider  BIOTIN PO Take 1 tablet by mouth daily.   Yes Historical Provider, MD  Multiple Vitamin (MULTIVITAMIN WITH MINERALS) TABS tablet Take 1 tablet by mouth daily.   Yes Historical Provider, MD  pantoprazole (PROTONIX) 40 MG tablet Take 40 mg by mouth daily.   Yes Historical Provider, MD     History   Social History  . Marital Status: Divorced    Spouse Name: N/A    Number of Children: N/A  . Years of Education: N/A   Occupational History  . Not on file.   Social History Main Topics  . Smoking status: Never Smoker   . Smokeless tobacco:  Not on file  . Alcohol Use: Yes     Comment: occasionally  . Drug Use: No  . Sexual Activity: Not on file   Other Topics Concern  . Not on file   Social History Narrative  . No narrative on file    Family Status  Relation Status Death Age  . Mother      alive with no health problems  . Father      alive with no health problems   No family history on file.   ROS:  Full 14 point review of systems complete and found to be negative unless listed above.  Physical Exam: Blood pressure 148/81, pulse 71, temperature 97.8 F (36.6 C), temperature source Oral,  resp. rate 18, SpO2 97.00%.  Constitutional: She appears well-developed and well-nourished. No distress.  Head: Normocephalic and atraumatic.  Mouth/Throat: Oropharynx is clear and moist. No oropharyngeal exudate.  Eyes: Conjunctivae and EOM are normal. Pupils are equal, round, and reactive to light.Neck: Normal range of motion. Neck supple. No JVD present. No thyromegaly present.  Cardiovascular: Normal rate, regular rhythm, normal heart sounds and intact distal pulses. Exam reveals no gallop and no friction rub.  No murmur heard.  Pulmonary/Chest: Effort normal and breath sounds normal. No respiratory distress. She has no wheezes. She has no rales. She exhibits no tenderness.  Abdominal: Soft. Bowel sounds are normal. She exhibits no distension and no mass. There is no tenderness.  Musculoskeletal: Normal range of motion. She exhibits no edema and no tenderness. No tenderness to palpation over chest Lymphadenopathy:  She has no cervical adenopathy.  Neurological: She is alert. Coordination normal.  Skin: Skin is warm and dry. No rash noted. No erythema.  Psychiatric: She has a normal mood and affect. Her behavior is normal.    Labs:   Lab Results  Component Value Date   WBC 3.4* 10/06/2013   HGB 11.6* 10/06/2013   HCT 34.0* 10/06/2013   MCV 88.7 10/06/2013   PLT 137* 10/06/2013    Recent Labs  10/06/13 0445  INR 0.99     Recent Labs Lab 10/06/13 0445 10/06/13 0453  NA 138 143  K 3.4* 3.6  CL 105 107  CO2 23  --   BUN 17 16  CREATININE 0.66 0.80  CALCIUM 9.5  --   PROT 7.2  --   BILITOT 0.3  --   ALKPHOS 77  --   ALT 18  --   AST 23  --   GLUCOSE 99 101*  ALBUMIN 3.7  --     Recent Labs  10/06/13 0450  TROPIPOC 0.00    ECG:  Vent. rate 76 BPM PR interval 198 ms QRS duration 88 ms QT/QTc 400/450 ms P-R-T axes 82 84 43  NSR  Radiology:  Dg Chest Portable 1 View  10/06/2013   CLINICAL DATA:  Right-sided chest pain, nausea and dizziness.  EXAM:  PORTABLE CHEST - 1 VIEW  COMPARISON:  None.  FINDINGS: The lungs are well-aerated and clear. There is no evidence of focal opacification, pleural effusion or pneumothorax.  The cardiomediastinal silhouette is within normal limits. No acute osseous abnormalities are seen.  IMPRESSION: No acute cardiopulmonary process seen.     ASSESSMENT AND PLAN:    Principal Problem:   Chest tightness or pressure Active Problems:   GERD (gastroesophageal reflux disease)   Lupus   Chest pain: troponin negative, EKG unremarkable and pain relieved by GI cocktail in ED.  Patient has a very low pre  test probablilty for CAD -troponin q6hours     Signed: Thereasa Parkin, PA-C 10/06/2013 9:40 AM   Co-Sign MD  Patient seen and examined and history reviewed. Agree with above findings and plan. 47 yo BF with history of GERD presents for evaluation of chest pain. Pain described and right chest tightness radiating to back and associated with nausea and dizzyness. No known cardiac risk factors. Ecg and initial troponin are normal. Patient is at low risk for cardiac disease but states pain is different than her GERD pain. Will proceed with stress Echo today. If negative can pursue work up of noncardiac causes.   Theron Arista [facility] 10/06/2013 10:49 AM

## 2013-10-09 ENCOUNTER — Telehealth: Payer: Self-pay

## 2013-10-09 NOTE — Telephone Encounter (Signed)
LEFT PT VM TO RETURN CALL IN REF TO NP APPT. °

## 2013-10-27 LAB — HM PAP SMEAR: HM Pap smear: NORMAL

## 2013-10-27 LAB — HM MAMMOGRAPHY: HM Mammogram: NORMAL

## 2014-12-01 IMAGING — CR DG CHEST 1V PORT
1 series · 1 of 1 positions shown · non-contrast
Comparison: None.

CLINICAL DATA: Right-sided chest pain, nausea and dizziness.

EXAM:
PORTABLE CHEST - 1 VIEW

[AP]
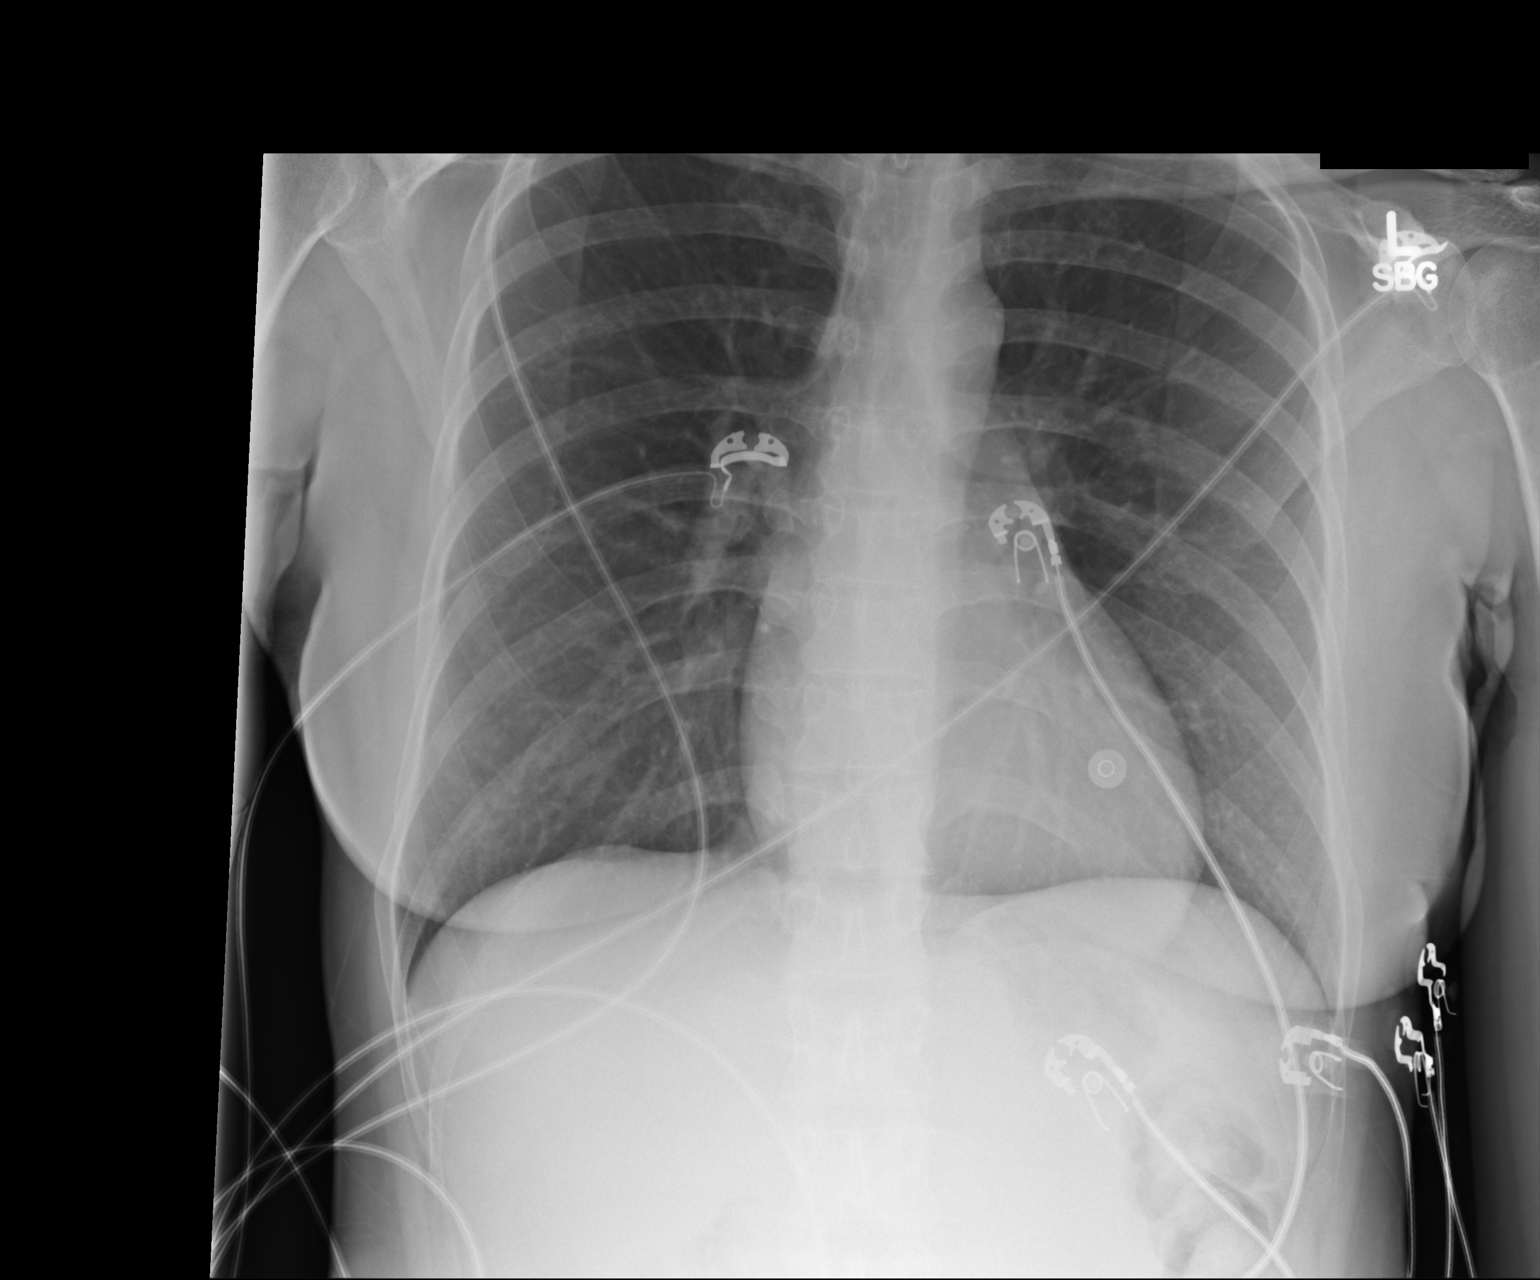

[1 of 1 positions shown; findings below may reference images not displayed]

FINDINGS: The lungs are well-aerated and clear. There is no evidence of focal
opacification, pleural effusion or pneumothorax.

The cardiomediastinal silhouette is within normal limits. No acute
osseous abnormalities are seen.
IMPRESSION: No acute cardiopulmonary process seen.

## 2015-02-11 ENCOUNTER — Other Ambulatory Visit: Payer: Self-pay | Admitting: Gastroenterology

## 2015-03-22 ENCOUNTER — Other Ambulatory Visit: Payer: Self-pay | Admitting: Obstetrics and Gynecology

## 2015-03-23 LAB — CYTOLOGY - PAP

## 2015-06-29 ENCOUNTER — Encounter: Payer: Self-pay | Admitting: Family Medicine

## 2015-06-29 DIAGNOSIS — M3500 Sicca syndrome, unspecified: Secondary | ICD-10-CM | POA: Insufficient documentation

## 2015-06-29 DIAGNOSIS — D75A Glucose-6-phosphate dehydrogenase (G6PD) deficiency without anemia: Secondary | ICD-10-CM | POA: Insufficient documentation

## 2015-06-29 DIAGNOSIS — D72829 Elevated white blood cell count, unspecified: Secondary | ICD-10-CM | POA: Insufficient documentation

## 2015-06-29 DIAGNOSIS — K219 Gastro-esophageal reflux disease without esophagitis: Secondary | ICD-10-CM | POA: Insufficient documentation

## 2015-06-29 DIAGNOSIS — E041 Nontoxic single thyroid nodule: Secondary | ICD-10-CM | POA: Insufficient documentation

## 2015-06-29 DIAGNOSIS — G43009 Migraine without aura, not intractable, without status migrainosus: Secondary | ICD-10-CM | POA: Insufficient documentation

## 2015-06-29 DIAGNOSIS — Z780 Asymptomatic menopausal state: Secondary | ICD-10-CM | POA: Insufficient documentation

## 2015-06-29 DIAGNOSIS — J302 Other seasonal allergic rhinitis: Secondary | ICD-10-CM | POA: Insufficient documentation

## 2015-07-02 ENCOUNTER — Encounter: Payer: Self-pay | Admitting: Family Medicine

## 2021-08-03 ENCOUNTER — Emergency Department: Payer: No Typology Code available for payment source

## 2021-08-03 ENCOUNTER — Emergency Department
Admission: EM | Admit: 2021-08-03 | Discharge: 2021-08-03 | Disposition: A | Discharge status: Home / Self Care | Payer: No Typology Code available for payment source | Attending: Emergency Medicine | Admitting: Emergency Medicine

## 2021-08-03 ENCOUNTER — Other Ambulatory Visit: Payer: Self-pay

## 2021-08-03 ENCOUNTER — Encounter: Payer: Self-pay | Admitting: Emergency Medicine

## 2021-08-03 DIAGNOSIS — R0602 Shortness of breath: Secondary | ICD-10-CM | POA: Diagnosis not present

## 2021-08-03 DIAGNOSIS — R059 Cough, unspecified: Secondary | ICD-10-CM | POA: Insufficient documentation

## 2021-08-03 DIAGNOSIS — Z5321 Procedure and treatment not carried out due to patient leaving prior to being seen by health care provider: Secondary | ICD-10-CM | POA: Insufficient documentation

## 2021-08-03 LAB — CBC WITH DIFFERENTIAL/PLATELET
Abs Immature Granulocytes: 0.01 10*3/uL (ref 0.00–0.07)
Basophils Absolute: 0 10*3/uL (ref 0.0–0.1)
Basophils Relative: 1 %
Eosinophils Absolute: 0.1 10*3/uL (ref 0.0–0.5)
Eosinophils Relative: 2 %
HCT: 32.1 % — ABNORMAL LOW (ref 36.0–46.0)
Hemoglobin: 11.1 g/dL — ABNORMAL LOW (ref 12.0–15.0)
Immature Granulocytes: 0 %
Lymphocytes Relative: 38 %
Lymphs Abs: 1.3 10*3/uL (ref 0.7–4.0)
MCH: 31.5 pg (ref 26.0–34.0)
MCHC: 34.6 g/dL (ref 30.0–36.0)
MCV: 91.2 fL (ref 80.0–100.0)
Monocytes Absolute: 0.3 10*3/uL (ref 0.1–1.0)
Monocytes Relative: 8 %
Neutro Abs: 1.6 10*3/uL — ABNORMAL LOW (ref 1.7–7.7)
Neutrophils Relative %: 51 %
Platelets: 165 10*3/uL (ref 150–400)
RBC: 3.52 MIL/uL — ABNORMAL LOW (ref 3.87–5.11)
RDW: 12.5 % (ref 11.5–15.5)
WBC: 3.3 10*3/uL — ABNORMAL LOW (ref 4.0–10.5)
nRBC: 0 % (ref 0.0–0.2)

## 2021-08-03 LAB — COMPREHENSIVE METABOLIC PANEL
ALT: 16 U/L (ref 0–44)
AST: 22 U/L (ref 15–41)
Albumin: 3.7 g/dL (ref 3.5–5.0)
Alkaline Phosphatase: 77 U/L (ref 38–126)
Anion gap: 6 (ref 5–15)
BUN: 18 mg/dL (ref 6–20)
CO2: 23 mmol/L (ref 22–32)
Calcium: 9 mg/dL (ref 8.9–10.3)
Chloride: 110 mmol/L (ref 98–111)
Creatinine, Ser: 0.64 mg/dL (ref 0.44–1.00)
GFR, Estimated: >60 mL/min (ref >60)
Glucose, Bld: 105 mg/dL — ABNORMAL HIGH (ref 70–99)
Potassium: 3.5 mmol/L (ref 3.5–5.1)
Sodium: 139 mmol/L (ref 135–145)
Total Bilirubin: 0.5 mg/dL (ref 0.3–1.2)
Total Protein: 6.7 g/dL (ref 6.5–8.1)

## 2021-08-03 LAB — TROPONIN I (HIGH SENSITIVITY): Troponin I (High Sensitivity): 5 ng/L (ref <18)

## 2021-08-03 NOTE — ED Notes (Signed)
Pt refusing CXR at this time.

## 2021-08-03 NOTE — ED Triage Notes (Signed)
Patient ambulatory to triage with steady gait, without difficulty or distress noted; pt reports SHOB and nonprod cough several days
